# Patient Record
Sex: Female | Born: 1985 | Race: Black or African American | Hispanic: No | Marital: Married | Smoking: Never smoker
Health system: Southern US, Community
[De-identification: ages and names within clinical notes are randomized; demographics above are authoritative.]

## PROBLEM LIST (undated history)

## (undated) DIAGNOSIS — N809 Endometriosis, unspecified: Secondary | ICD-10-CM

## (undated) HISTORY — PX: TONSILLECTOMY: SUR1361

---
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
## 2007-12-09 ENCOUNTER — Ambulatory Visit: Payer: Self-pay | Admitting: Unknown Physician Specialty

## 2007-12-12 ENCOUNTER — Ambulatory Visit: Payer: Self-pay | Admitting: Unknown Physician Specialty

## 2012-10-26 ENCOUNTER — Emergency Department: Payer: Self-pay | Admitting: Emergency Medicine

## 2012-10-26 LAB — CBC
HCT: 49 % — ABNORMAL HIGH (ref 35.0–47.0)
MCH: 28.4 pg (ref 26.0–34.0)
MCHC: 33 g/dL (ref 32.0–36.0)
MCV: 86 fL (ref 80–100)
Platelet: 207 10*3/uL (ref 150–440)
RBC: 5.7 10*6/uL — ABNORMAL HIGH (ref 3.80–5.20)
RDW: 14.6 % — ABNORMAL HIGH (ref 11.5–14.5)

## 2012-10-26 LAB — LIPASE, BLOOD: Lipase: 96 U/L (ref 73–393)

## 2012-10-26 LAB — URINALYSIS, COMPLETE
Bilirubin,UR: NEGATIVE
Ketone: NEGATIVE
Nitrite: NEGATIVE
RBC,UR: 2 /HPF (ref 0–5)
WBC UR: 4 /HPF (ref 0–5)

## 2012-10-26 LAB — COMPREHENSIVE METABOLIC PANEL
BUN: 9 mg/dL (ref 7–18)
Bilirubin,Total: 0.9 mg/dL (ref 0.2–1.0)
Calcium, Total: 8.8 mg/dL (ref 8.5–10.1)
Creatinine: 1.01 mg/dL (ref 0.60–1.30)
EGFR (African American): >60
Glucose: 130 mg/dL — ABNORMAL HIGH (ref 65–99)
SGOT(AST): 26 U/L (ref 15–37)
Total Protein: 7.8 g/dL (ref 6.4–8.2)

## 2012-10-26 LAB — CLOSTRIDIUM DIFFICILE BY PCR

## 2012-10-28 LAB — STOOL CULTURE

## 2020-05-14 ENCOUNTER — Other Ambulatory Visit: Payer: Self-pay | Admitting: Family Medicine

## 2020-05-14 ENCOUNTER — Other Ambulatory Visit: Payer: Self-pay

## 2020-05-14 ENCOUNTER — Ambulatory Visit
Admission: RE | Admit: 2020-05-14 | Discharge: 2020-05-14 | Disposition: A | Discharge status: Home / Self Care | Payer: Managed Care, Other (non HMO) | Source: Ambulatory Visit | Attending: Family Medicine | Admitting: Family Medicine

## 2020-05-14 DIAGNOSIS — M79609 Pain in unspecified limb: Secondary | ICD-10-CM

## 2020-05-14 DIAGNOSIS — M79661 Pain in right lower leg: Secondary | ICD-10-CM | POA: Diagnosis not present

## 2020-06-30 ENCOUNTER — Other Ambulatory Visit: Payer: Self-pay | Admitting: Obstetrics and Gynecology

## 2020-06-30 DIAGNOSIS — N979 Female infertility, unspecified: Secondary | ICD-10-CM

## 2020-07-15 ENCOUNTER — Ambulatory Visit: Payer: 59

## 2020-07-20 ENCOUNTER — Ambulatory Visit: Payer: 59

## 2020-07-27 ENCOUNTER — Other Ambulatory Visit (HOSPITAL_COMMUNITY): Payer: Self-pay | Admitting: Obstetrics and Gynecology

## 2020-08-13 ENCOUNTER — Other Ambulatory Visit: Payer: Self-pay

## 2020-08-13 ENCOUNTER — Ambulatory Visit
Admission: RE | Admit: 2020-08-13 | Discharge: 2020-08-13 | Disposition: A | Discharge status: Home / Self Care | Payer: Managed Care, Other (non HMO) | Source: Ambulatory Visit | Attending: Obstetrics and Gynecology | Admitting: Obstetrics and Gynecology

## 2020-08-13 ENCOUNTER — Ambulatory Visit (HOSPITAL_COMMUNITY): Payer: 59

## 2020-08-13 DIAGNOSIS — N921 Excessive and frequent menstruation with irregular cycle: Secondary | ICD-10-CM | POA: Insufficient documentation

## 2020-08-13 DIAGNOSIS — N979 Female infertility, unspecified: Secondary | ICD-10-CM

## 2020-08-13 MED ORDER — IOHEXOL 300 MG/ML  SOLN
50.0000 mL | Freq: Once | INTRAMUSCULAR | Status: AC | PRN
  Administered 2020-08-13: 15 mL

## 2020-08-17 NOTE — Op Note (Signed)
NAME: Christy Glenn, Christy Glenn MEDICAL RECORD [id number] ACCOUNT [id number] DATE OF BIRTH:12-19-1985 FACILITY: [facility] LOCATION: [facility] PHYSICIAN:Khye Hochstetler Cloyde Reams, MD  OPERATIVE REPORT  DATE OF PROCEDURE:  08/13/2020  PREPROCEDURE DIAGNOSIS:  Infertility.  POSTPROCEDURE DIAGNOSIS:  Infertility.  SURGEON:  Jennell Corner, MD  PROCEDURE:  Hysterosalpingogram.  INDICATIONS:  A 34 year old female with infertility, with needing assessment of tubal patency.  DESCRIPTION OF PROCEDURE:  The patient was placed in the supine position.  A speculum was placed in the vagina.  The anterior cervix was grasped with single tooth tenaculum and cervix was cleansed with Betadine.  The hysterosalpingogram catheter was  placed into the endometrial cavity.  The balloon was inflated.  Radiopaque liquid Isovue was loaded on the syringe and the patient was then placed for fluoroscopy.  Speculum was removed.  Injection during the fluoroscopy showed normal contour of the  uterus and normal fill and spill bilaterally of the fallopian tubes.  All instruments were removed.  COMPLICATIONS:  There were no complications.  DISPOSITION:  The patient tolerated the procedure well.  HN/NUANCE  D:08/13/2020 T:08/14/2020 JOB:[id number]

## 2021-03-30 ENCOUNTER — Ambulatory Visit
Admission: EM | Admit: 2021-03-30 | Discharge: 2021-03-30 | Disposition: A | Discharge status: Home / Self Care | Payer: BC Managed Care – PPO | Attending: Sports Medicine | Admitting: Sports Medicine

## 2021-03-30 ENCOUNTER — Other Ambulatory Visit: Payer: Self-pay

## 2021-03-30 ENCOUNTER — Encounter: Payer: Self-pay | Admitting: Emergency Medicine

## 2021-03-30 DIAGNOSIS — R350 Frequency of micturition: Secondary | ICD-10-CM

## 2021-03-30 DIAGNOSIS — N3000 Acute cystitis without hematuria: Secondary | ICD-10-CM | POA: Insufficient documentation

## 2021-03-30 DIAGNOSIS — R102 Pelvic and perineal pain: Secondary | ICD-10-CM | POA: Diagnosis present

## 2021-03-30 DIAGNOSIS — R3915 Urgency of urination: Secondary | ICD-10-CM | POA: Diagnosis present

## 2021-03-30 DIAGNOSIS — R300 Dysuria: Secondary | ICD-10-CM

## 2021-03-30 HISTORY — DX: Endometriosis, unspecified: N80.9

## 2021-03-30 LAB — URINALYSIS, COMPLETE (UACMP) WITH MICROSCOPIC
Bilirubin Urine: NEGATIVE
Glucose, UA: NEGATIVE mg/dL
Ketones, ur: NEGATIVE mg/dL
Leukocytes,Ua: NEGATIVE
Nitrite: NEGATIVE
Protein, ur: NEGATIVE mg/dL
Specific Gravity, Urine: 1.02 (ref 1.005–1.030)
pH: 6.5 (ref 5.0–8.0)

## 2021-03-30 MED ORDER — PHENAZOPYRIDINE HCL 200 MG PO TABS
200.0000 mg | ORAL_TABLET | Freq: Three times a day (TID) | ORAL | 0 refills | Status: AC
Start: 2021-03-30 — End: ?

## 2021-03-30 MED ORDER — NITROFURANTOIN MONOHYD MACRO 100 MG PO CAPS: 100.0000 mg | ORAL_CAPSULE | Freq: Two times a day (BID) | ORAL | 0 refills | Status: AC

## 2021-03-30 NOTE — ED Provider Notes (Signed)
[facility]    CSN: [id number] Arrival date & time: 03/30/21  1010      History   Chief Complaint Chief Complaint  Patient presents with   Dysuria    HPI Christy Glenn is a 35 y.o. female.   35 year old female who presents for evaluation of 24 hours of UTI symptoms.  She is complaining of dysuria, increased urinary frequency and urgency as well as some suprapubic pressure and discomfort.  She denies any flank pain.  No vaginal discharge.  No vaginal bleeding.  She is currently on her menses and has noted hematuria as well.  She denies any recurrent UTIs.  No history of renal stones.  No concern for an STD.  No fever shakes chills.  No nausea vomiting diarrhea.  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  Her primary care physician is in [place].  No chest pain or shortness of breath.  No red flag signs or symptoms elicited on history.   Past Medical History:  Diagnosis Date   Endometriosis     There are no problems to display for this patient.   Past Surgical History:  Procedure Laterality Date   TONSILLECTOMY      OB History   No obstetric history on file.      Home Medications    Prior to Admission medications   Medication Sig Start Date End Date Taking? Authorizing Provider  albuterol (VENTOLIN HFA) 108 (90 Base) MCG/ACT inhaler Inhale into the lungs. 07/29/19  Yes [provider]  budesonide-formoterol (SYMBICORT) 160-4.5 MCG/ACT inhaler Inhale into the lungs. 06/06/16  Yes [provider]  nitrofurantoin, macrocrystal-monohydrate, (MACROBID) 100 MG capsule Take 1 capsule (100 mg total) by mouth 2 (two) times daily. 03/30/21  Yes Delton See, MD  phenazopyridine (PYRIDIUM) 200 MG tablet Take 1 tablet (200 mg total) by mouth 3 (three) times daily. 03/30/21  Yes Delton See, MD  Prenatal Multivit-Min-Fe-FA (PRENATAL, W/IRON & FA,) 27-0.8 MG TABS Take 1 tablet by mouth daily.    Yes [provider]    Family History No family history on file.  Social History Social History   Tobacco Use   Smoking status: Never   Smokeless tobacco: Never  Vaping Use   Vaping Use: Never used  Substance Use Topics   Alcohol use: Not Currently   Drug use: Not Currently     Allergies   Patient has no known allergies.   Review of Systems Review of Systems  Constitutional:  Negative for activity change, appetite change, chills, diaphoresis, fatigue and fever.  HENT:  Negative for congestion, ear pain, postnasal drip, rhinorrhea, sinus pressure, sinus pain, sneezing and sore throat.   Eyes:  Negative for pain.  Respiratory:  Negative for cough, chest tightness and shortness of breath.   Cardiovascular:  Negative for chest pain and palpitations.  Gastrointestinal:  Positive for abdominal pain. Negative for diarrhea, nausea and vomiting.  Endocrine: Negative for polydipsia, polyphagia and polyuria.  Genitourinary:  Positive for dysuria, frequency, hematuria and urgency. Negative for flank pain, vaginal bleeding, vaginal discharge and vaginal pain.  Musculoskeletal:  Negative for back pain, myalgias and neck pain.  Skin:  Negative for color change, pallor, rash and wound.  Neurological:  Negative for dizziness, syncope, light-headedness and headaches.  All other systems reviewed and are negative.   Physical Exam Triage Vital Signs ED Triage Vitals  Enc Vitals Group     BP 03/30/21 1113  132/90     Pulse Rate 03/30/21 1113 73     Resp 03/30/21 1113 18     Temp 03/30/21 1113 98.5 F (36.9 C)     Temp Source 03/30/21 1113 Oral     SpO2 03/30/21 1113 99 %     Weight 03/30/21 1114 166 lb (75.3 kg)     Height 03/30/21 1114 5\' 2"  (1.575 m)     Head Circumference --      Peak Flow --      Pain Score 03/30/21 1114 2     Pain Loc --      Pain Edu? --      Excl. in GC? --    No data found.  Updated Vital Signs BP 132/90 (BP Location: Right Arm)   Pulse 73    Temp 98.5 F (36.9 C) (Oral)   Resp 18   Ht 5\' 2"  (1.575 m)   Wt 75.3 kg   LMP 03/28/2021 (Exact Date)   SpO2 99%   BMI 30.36 kg/m   Visual Acuity Right Eye Distance:   Left Eye Distance:   Bilateral Distance:    Right Eye Near:   Left Eye Near:    Bilateral Near:     Physical Exam Vitals and nursing note reviewed.  Constitutional:      General: She is not in acute distress.    Appearance: Normal appearance. She is not ill-appearing, toxic-appearing or diaphoretic.  HENT:     Head: Normocephalic and atraumatic.     Nose: Nose normal.     Mouth/Throat:     Mouth: Mucous membranes are moist.  Eyes:     Conjunctiva/sclera: Conjunctivae normal.     Pupils: Pupils are equal, round, and reactive to light.  Cardiovascular:     Rate and Rhythm: Normal rate and regular rhythm.     Pulses: Normal pulses.     Heart sounds: Normal heart sounds. No murmur heard.   No friction rub. No gallop.  Pulmonary:     Effort: Pulmonary effort is normal.     Breath sounds: Normal breath sounds. No stridor. No wheezing, rhonchi or rales.  Abdominal:     General: Abdomen is flat. Bowel sounds are normal.     Palpations: Abdomen is soft. There is no hepatomegaly or splenomegaly.     Tenderness: There is abdominal tenderness in the suprapubic area. There is no right CVA tenderness, left CVA tenderness, guarding or rebound.  Musculoskeletal:     Cervical back: Normal range of motion and neck supple.  Skin:    General: Skin is warm and dry.     Capillary Refill: Capillary refill takes less than 2 seconds.  Neurological:     General: No focal deficit present.     Mental Status: She is alert and oriented to person, place, and time.     UC Treatments / Results  Labs (all labs ordered are listed, but only abnormal results are displayed) Labs Reviewed  URINALYSIS, COMPLETE (UACMP) WITH MICROSCOPIC - Abnormal; Notable for the following components:      Result Value   Hgb urine dipstick SMALL  (*)    Bacteria, UA FEW (*)    All other components within normal limits  URINE CULTURE    EKG   Radiology No results found.  Procedures Procedures (including critical care time)  Medications Ordered in UC Medications - No data to display  Initial Impression / Assessment and Plan / UC Course  I have reviewed the  triage vital signs and the nursing notes.  Pertinent labs & imaging results that were available during my care of the patient were reviewed by me and considered in my medical decision making (see chart for details).  Clinical impression: 1.  Dysuria 2.  Increased urinary frequency 3.  Increased urinary urgency 4.  Acute cystitis without hematuria 5.  Suprapubic abdominal pain  Treatment plan: 1.  The findings and treatment plan were discussed in detail with the patient.  Patient was in agreement. 2.  We will obtain a UA.  It did show bacteria, increased WBCs, and WBC clumps.  We will treat for UTI and send off the culture. 3.  Educational handouts provided. 4.  Plenty of rest, plenty fluids, Tylenol or Motrin for any fever or discomfort.  Went over the red flag signs and symptoms and what to watch for concerning pyelonephritis. 5.  Sent in the antibiotic along with Pyridium. 6.  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 7.  If symptoms persist she should see her PCP. 8.  If they worsen at all she should go to the ER. 9.  She was discharged in stable condition will follow-up here as needed.    Final Clinical Impressions(s) / UC Diagnoses   Final diagnoses:  Dysuria  Urinary frequency  Urinary urgency  Suprapubic abdominal pain  Acute cystitis without hematuria     Discharge Instructions      As we discussed, your urine shows that you have a potential urinary tract infection.  I will go ahead and treat you and send off the culture. Please see educational handouts. Please flush your system with plenty of water. 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 Plenty of rest, plenty fluids, Tylenol or Motrin for any discomfort. If you develop worsening abdominal pain, nausea, vomiting, back pain, or increased symptoms it could be a sign of an ascending infection called pyelonephritis which is an infection into your kidneys.  If this occurs then you need to seek out care at an emergency room setting. If symptoms persist you should follow-up with your primary care provider. If symptoms worsen please go to the ER.     ED Prescriptions     Medication Sig Dispense Auth. Provider   nitrofurantoin, macrocrystal-monohydrate, (MACROBID) 100 MG capsule Take 1 capsule (100 mg total) by mouth 2 (two) times daily. 10 capsule Delton See, MD   phenazopyridine (PYRIDIUM) 200 MG tablet Take 1 tablet (200 mg total) by mouth 3 (three) times daily. 6 tablet Delton See, MD      PDMP not reviewed this encounter.   Delton See, MD 03/30/21 1149

## 2021-03-30 NOTE — ED Triage Notes (Signed)
Pt c/o dysuria, urinary frequency. Started yesterday. Denies vaginal discharge.

## 2021-03-30 NOTE — Discharge Instructions (Addendum)
As we discussed, your urine shows that you have a potential urinary tract infection.  I will go ahead and treat you and send off the culture. Please see educational handouts. Please flush your system with plenty of water. 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 Plenty of rest, plenty fluids, Tylenol or Motrin for any discomfort. If you develop worsening abdominal pain, nausea, vomiting, back pain, or increased symptoms it could be a sign of an ascending infection called pyelonephritis which is an infection into your kidneys.  If this occurs then you need to seek out care at an emergency room setting. If symptoms persist you should follow-up with your primary care provider. If symptoms worsen please go to the ER.

## 2021-04-01 LAB — URINE CULTURE: Culture: <10000 — AB

## 2021-11-25 IMAGING — US US EXTREM LOW VENOUS*R*
1 series · 14 of 24 positions shown · non-contrast
Comparison: None.

CLINICAL DATA: Right calf pain.

EXAM:
RIGHT LOWER EXTREMITY VENOUS DOPPLER ULTRASOUND
TECHNIQUE: Gray-scale sonography with compression, as well as color and duplex
ultrasound, were performed to evaluate the deep venous system(s)
from the level of the common femoral vein through the popliteal and
proximal calf veins.

[Series 1: us venous img lower uni right (dvt) · portal-venous · 14 of 37 slices shown]
[im 1/37]
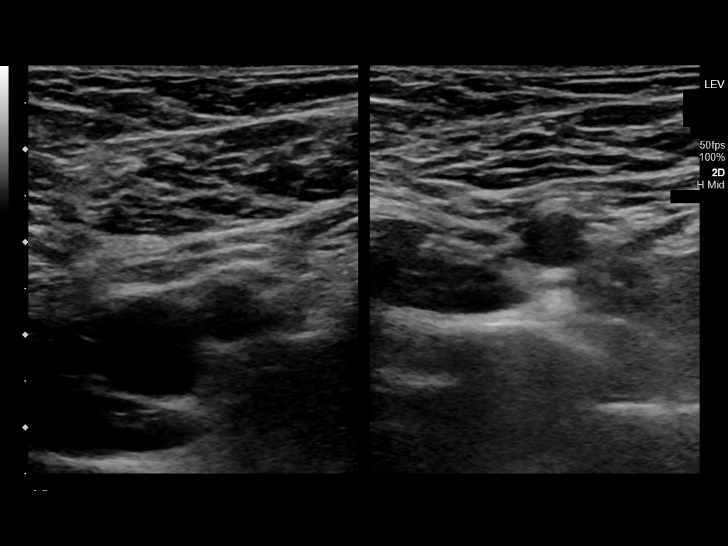
[im 4/37]
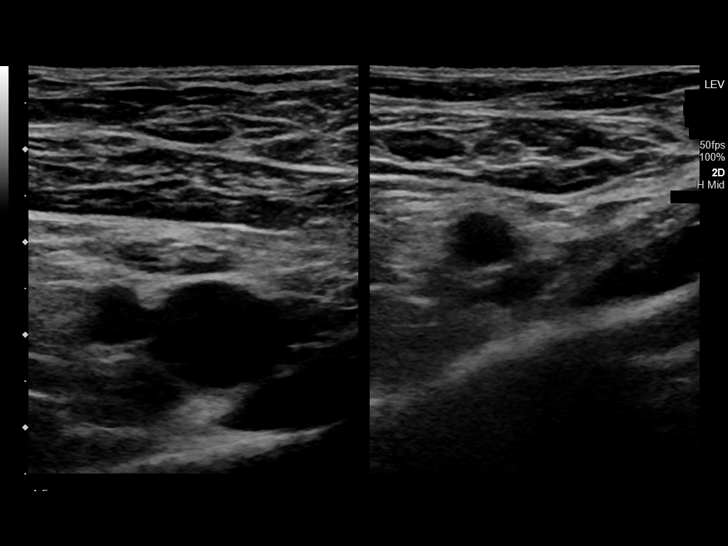
[im 7/37]
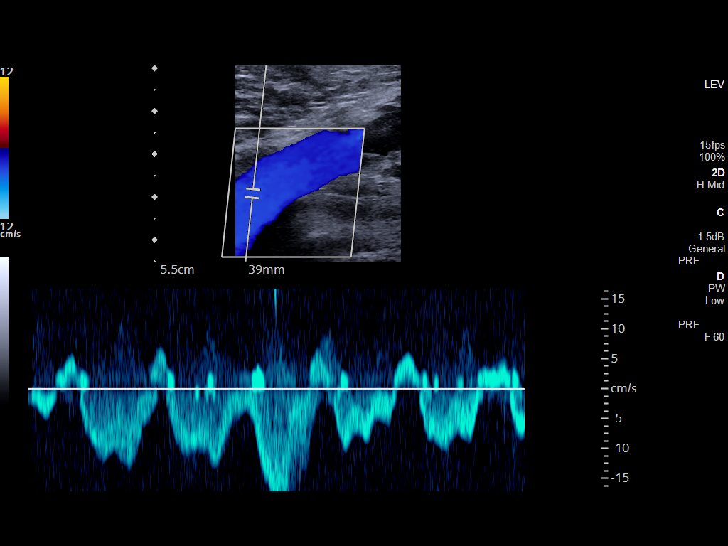
[im 10/37]
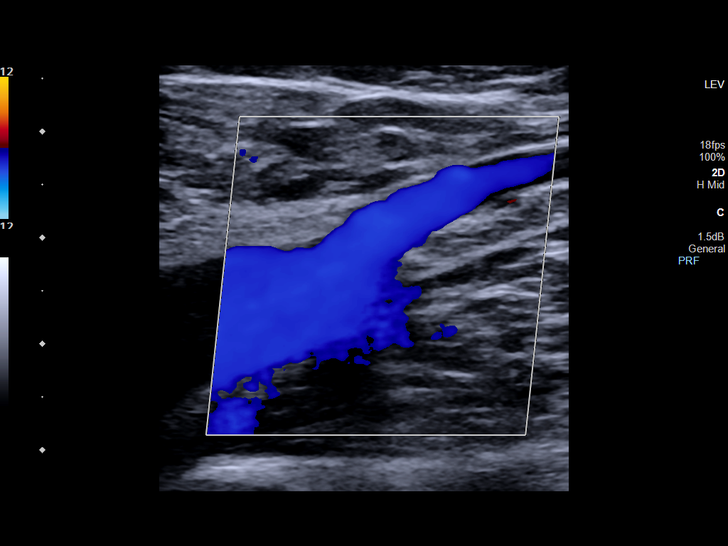
[im 11/37]
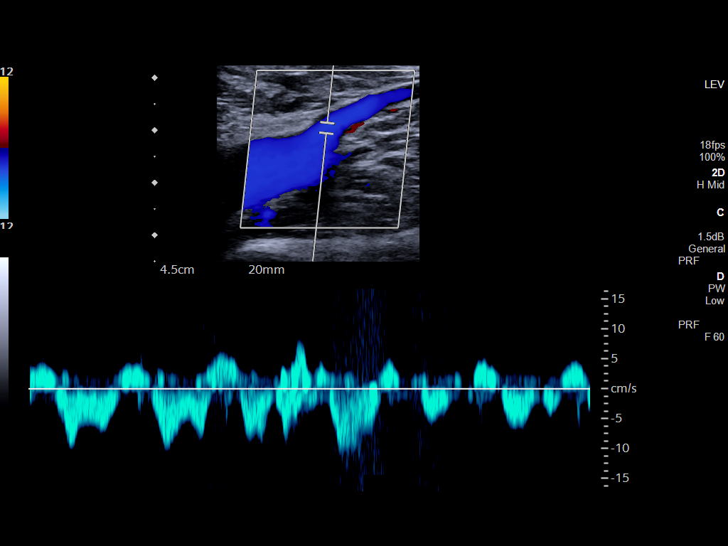
[im 15/37]
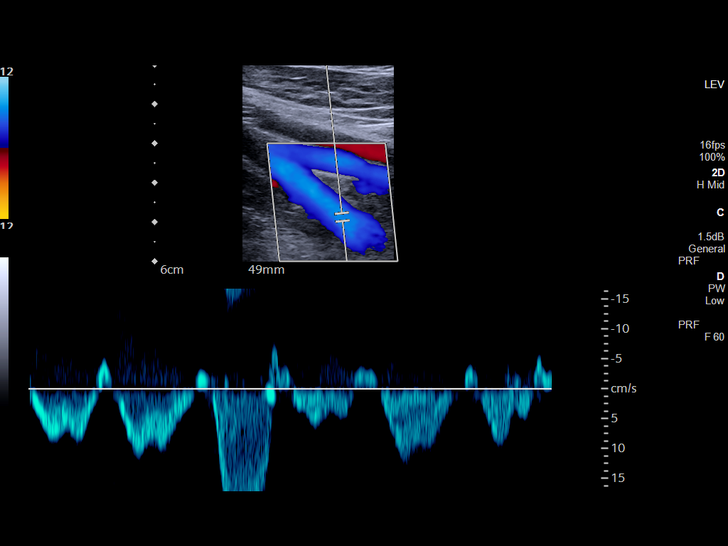
[im 18/37]
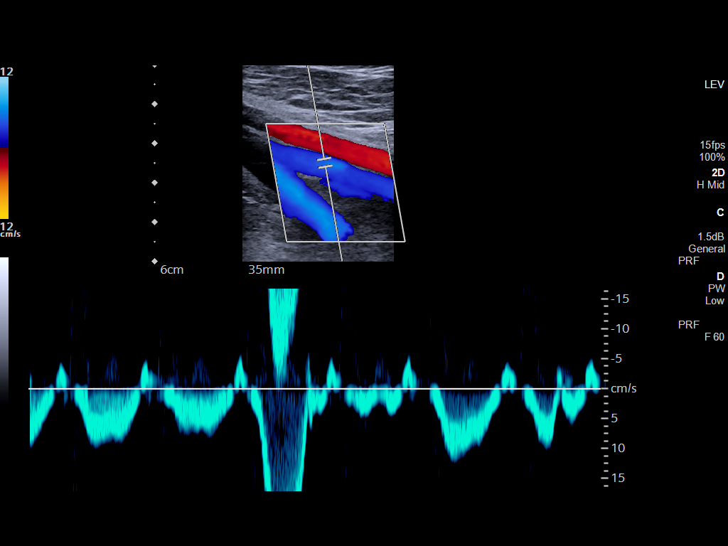
[im 19/37]
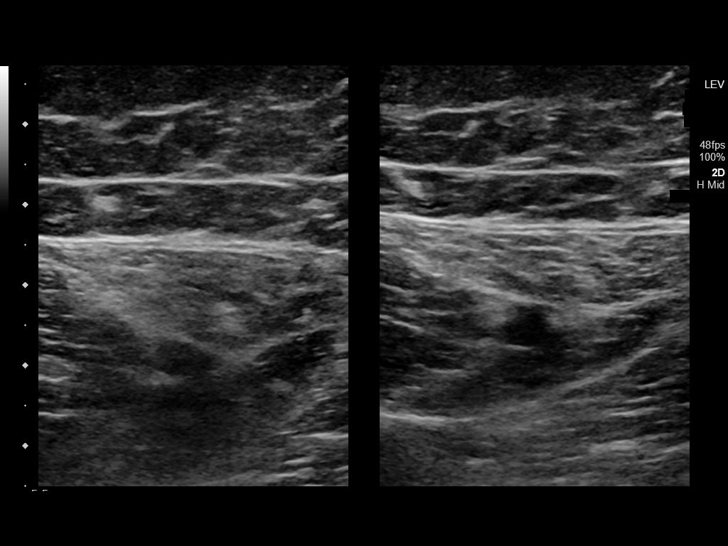
[im 22/37]
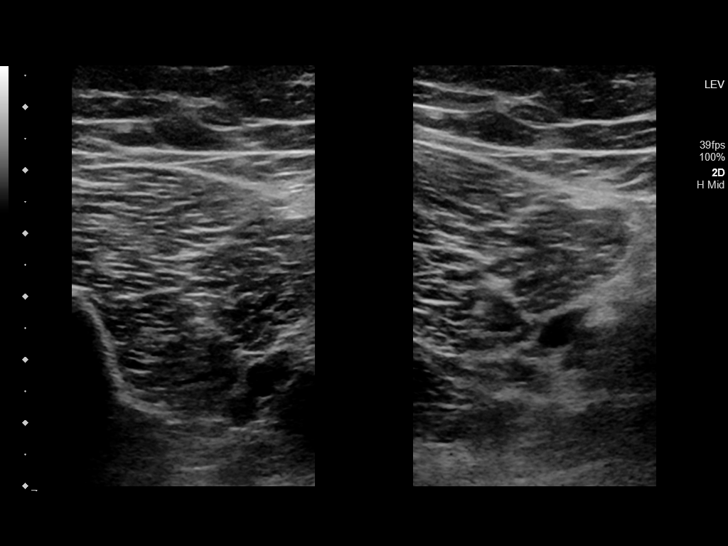
[im 26/37]
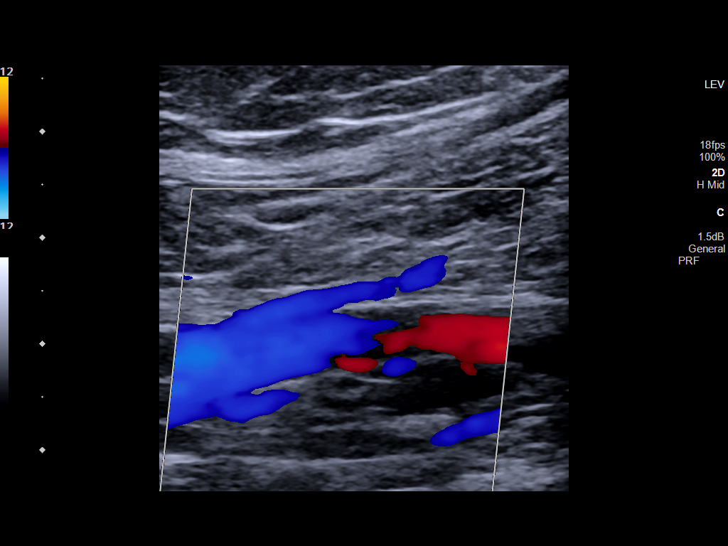
[im 29/37]
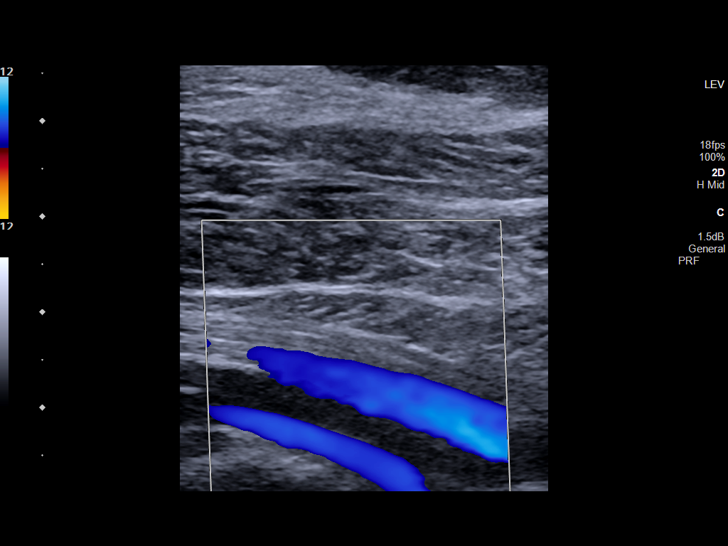
[im 30/37]
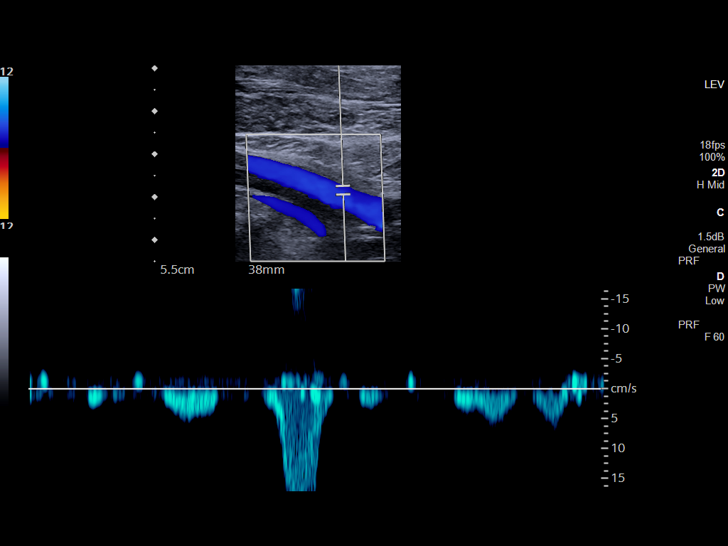
[im 33/37]
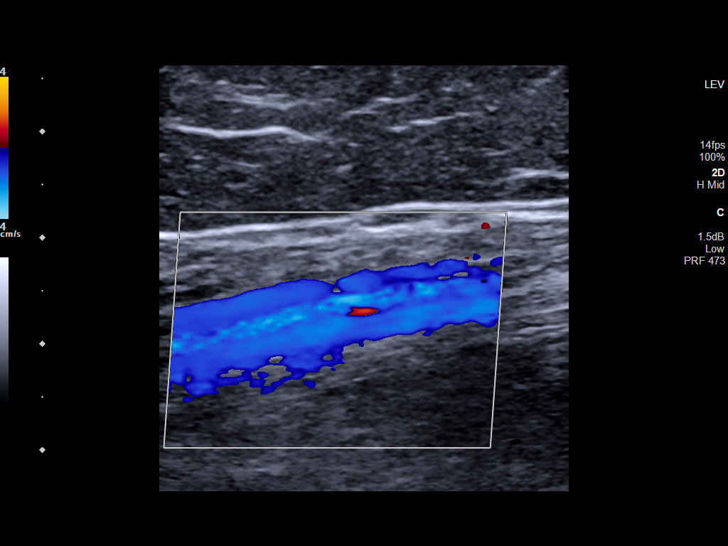
[im 37/37]
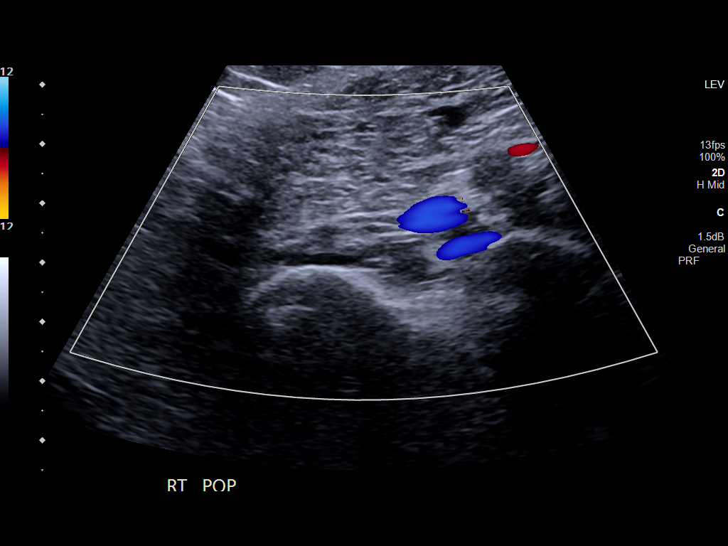

[14 of 24 positions shown; findings below may reference images not displayed]

FINDINGS: VENOUS

Normal compressibility of the common femoral, superficial femoral,
and popliteal veins, as well as the visualized calf veins.
Visualized portions of profunda femoral vein and great saphenous
vein unremarkable. No filling defects to suggest DVT on grayscale or
color Doppler imaging. Doppler waveforms show normal direction of
venous flow, normal respiratory plasticity and response to
augmentation.

Limited views of the contralateral common femoral vein are
unremarkable.

OTHER

None.

Limitations: none
IMPRESSION: Negative.
